# Patient Record
Sex: Male | Born: 1990 | Race: Black or African American | Hispanic: No | Marital: Single | Smoking: Current every day smoker
Health system: Southern US, Community
[De-identification: ages and names within clinical notes are randomized; demographics above are authoritative.]

## PROBLEM LIST (undated history)

## (undated) DIAGNOSIS — Z8619 Personal history of other infectious and parasitic diseases: Secondary | ICD-10-CM

---

## 2010-11-19 ENCOUNTER — Emergency Department (HOSPITAL_COMMUNITY): Payer: Self-pay

## 2010-11-19 ENCOUNTER — Emergency Department (HOSPITAL_COMMUNITY)
Admission: EM | Admit: 2010-11-19 | Discharge: 2010-11-19 | Disposition: A | Discharge status: Home / Self Care | Payer: Self-pay | Attending: Emergency Medicine | Admitting: Emergency Medicine

## 2010-11-19 DIAGNOSIS — S20219A Contusion of unspecified front wall of thorax, initial encounter: Secondary | ICD-10-CM | POA: Insufficient documentation

## 2010-11-19 DIAGNOSIS — R51 Headache: Secondary | ICD-10-CM | POA: Insufficient documentation

## 2010-11-19 DIAGNOSIS — R079 Chest pain, unspecified: Secondary | ICD-10-CM | POA: Insufficient documentation

## 2010-11-19 DIAGNOSIS — R059 Cough, unspecified: Secondary | ICD-10-CM | POA: Insufficient documentation

## 2010-11-19 DIAGNOSIS — S0990XA Unspecified injury of head, initial encounter: Secondary | ICD-10-CM | POA: Insufficient documentation

## 2010-11-19 DIAGNOSIS — Y92009 Unspecified place in unspecified non-institutional (private) residence as the place of occurrence of the external cause: Secondary | ICD-10-CM | POA: Insufficient documentation

## 2010-11-19 DIAGNOSIS — R55 Syncope and collapse: Secondary | ICD-10-CM | POA: Insufficient documentation

## 2010-11-19 DIAGNOSIS — R109 Unspecified abdominal pain: Secondary | ICD-10-CM | POA: Insufficient documentation

## 2010-11-19 DIAGNOSIS — R05 Cough: Secondary | ICD-10-CM | POA: Insufficient documentation

## 2010-11-19 DIAGNOSIS — R1011 Right upper quadrant pain: Secondary | ICD-10-CM | POA: Insufficient documentation

## 2010-11-19 DIAGNOSIS — S301XXA Contusion of abdominal wall, initial encounter: Secondary | ICD-10-CM | POA: Insufficient documentation

## 2010-11-19 LAB — POCT I-STAT, CHEM 8
BUN: 17 mg/dL (ref 6–23)
Calcium, Ion: 1.15 mmol/L (ref 1.12–1.32)
Chloride: 105 mEq/L (ref 96–112)
Potassium: 3.8 mEq/L (ref 3.5–5.1)

## 2010-11-19 MED ORDER — IOHEXOL 300 MG/ML  SOLN
125.0000 mL | Freq: Once | INTRAMUSCULAR | Status: AC | PRN
  Administered 2010-11-19: 125 mL via INTRAVENOUS

## 2011-01-15 ENCOUNTER — Emergency Department (HOSPITAL_COMMUNITY)
Admission: EM | Admit: 2011-01-15 | Discharge: 2011-01-15 | Disposition: A | Discharge status: Home / Self Care | Payer: Self-pay | Attending: Emergency Medicine | Admitting: Emergency Medicine

## 2011-01-15 DIAGNOSIS — T6391XA Toxic effect of contact with unspecified venomous animal, accidental (unintentional), initial encounter: Secondary | ICD-10-CM | POA: Insufficient documentation

## 2011-01-15 DIAGNOSIS — R220 Localized swelling, mass and lump, head: Secondary | ICD-10-CM | POA: Insufficient documentation

## 2011-01-15 DIAGNOSIS — R221 Localized swelling, mass and lump, neck: Secondary | ICD-10-CM | POA: Insufficient documentation

## 2011-01-15 DIAGNOSIS — T63391A Toxic effect of venom of other spider, accidental (unintentional), initial encounter: Secondary | ICD-10-CM | POA: Insufficient documentation

## 2011-01-16 ENCOUNTER — Emergency Department (HOSPITAL_COMMUNITY)
Admission: EM | Admit: 2011-01-16 | Discharge: 2011-01-16 | Disposition: A | Discharge status: Home / Self Care | Payer: Self-pay | Attending: Emergency Medicine | Admitting: Emergency Medicine

## 2011-01-16 DIAGNOSIS — R229 Localized swelling, mass and lump, unspecified: Secondary | ICD-10-CM | POA: Insufficient documentation

## 2011-01-16 DIAGNOSIS — T6391XA Toxic effect of contact with unspecified venomous animal, accidental (unintentional), initial encounter: Secondary | ICD-10-CM | POA: Insufficient documentation

## 2011-01-16 DIAGNOSIS — T63391A Toxic effect of venom of other spider, accidental (unintentional), initial encounter: Secondary | ICD-10-CM | POA: Insufficient documentation

## 2011-05-01 ENCOUNTER — Emergency Department (HOSPITAL_COMMUNITY)
Admission: EM | Admit: 2011-05-01 | Discharge: 2011-05-01 | Disposition: A | Discharge status: Home / Self Care | Payer: Self-pay | Attending: Emergency Medicine | Admitting: Emergency Medicine

## 2011-05-01 ENCOUNTER — Other Ambulatory Visit: Payer: Self-pay

## 2011-05-01 ENCOUNTER — Encounter: Payer: Self-pay | Admitting: Emergency Medicine

## 2011-05-01 DIAGNOSIS — R42 Dizziness and giddiness: Secondary | ICD-10-CM | POA: Insufficient documentation

## 2011-05-01 DIAGNOSIS — K625 Hemorrhage of anus and rectum: Secondary | ICD-10-CM | POA: Insufficient documentation

## 2011-05-01 DIAGNOSIS — R55 Syncope and collapse: Secondary | ICD-10-CM | POA: Insufficient documentation

## 2011-05-01 LAB — CBC
HCT: 42.9 % (ref 39.0–52.0)
Hemoglobin: 15.1 g/dL (ref 13.0–17.0)
MCV: 86 fL (ref 78.0–100.0)
RDW: 12.2 % (ref 11.5–15.5)
WBC: 6.7 10*3/uL (ref 4.0–10.5)

## 2011-05-01 LAB — COMPREHENSIVE METABOLIC PANEL
BUN: 10 mg/dL (ref 6–23)
CO2: 30 mEq/L (ref 19–32)
Calcium: 9 mg/dL (ref 8.4–10.5)
Chloride: 101 mEq/L (ref 96–112)
Creatinine, Ser: 1.01 mg/dL (ref 0.50–1.35)
GFR calc Af Amer: >90 mL/min (ref >90)
GFR calc non Af Amer: >90 mL/min (ref >90)
Glucose, Bld: 84 mg/dL (ref 70–99)
Total Bilirubin: 0.3 mg/dL (ref 0.3–1.2)

## 2011-05-01 LAB — URINALYSIS, ROUTINE W REFLEX MICROSCOPIC
Hgb urine dipstick: NEGATIVE
Nitrite: NEGATIVE
Protein, ur: 30 mg/dL — AB
Specific Gravity, Urine: 1.028 (ref 1.005–1.030)
Urobilinogen, UA: 1 mg/dL (ref 0.0–1.0)

## 2011-05-01 LAB — DIFFERENTIAL
Basophils Absolute: 0 10*3/uL (ref 0.0–0.1)
Eosinophils Relative: 8 % — ABNORMAL HIGH (ref 0–5)
Lymphocytes Relative: 14 % (ref 12–46)
Lymphs Abs: 1 10*3/uL (ref 0.7–4.0)
Monocytes Absolute: 0.8 10*3/uL (ref 0.1–1.0)
Monocytes Relative: 12 % (ref 3–12)
Neutro Abs: 4.4 10*3/uL (ref 1.7–7.7)

## 2011-05-01 LAB — URINE MICROSCOPIC-ADD ON

## 2011-05-01 LAB — OCCULT BLOOD, POC DEVICE: Fecal Occult Bld: POSITIVE

## 2011-05-01 LAB — RAPID URINE DRUG SCREEN, HOSP PERFORMED
Amphetamines: NOT DETECTED
Opiates: NOT DETECTED

## 2011-05-01 LAB — ETHANOL: Alcohol, Ethyl (B): <11 mg/dL (ref 0–11)

## 2011-05-01 NOTE — ED Notes (Signed)
Pt. Discharged to home ambulatory gait steady, NAD noted

## 2011-05-01 NOTE — ED Provider Notes (Signed)
History     CSN: [id number] Arrival date & time: 05/01/2011  6:07 PM   First MD Initiated Contact with Patient 05/01/11 1824      Chief Complaint  Patient presents with  . Loss of Consciousness    (Consider location/radiation/quality/duration/timing/severity/associated sxs/prior treatment) Patient is a 20 y.o. male presenting with syncope. The history is provided by the patient and the EMS personnel.  Loss of Consciousness   he states that he has been having rectal bleeding for the last 6 months. Today he had a large, which was all bright red blood. He denies any abdominal pain, nausea, vomiting. He got dizzy and passed out. There is some questionable as to whether there was a seizure. He did not lose control of bowel or bladder did not bite his lip or tongue. He has not seen a physician related to his rectal bleeding. Symptoms are severe. Nothing makes it better, nothing makes worse. He does occasionally drink alcohol, but has not consumed any today. He he does smoke marijuana and smokes cigarettes but cannot be specific about quantity. He denies using other drugs.  No past medical history on file.  No past surgical history on file.  No family history on file.  History  Substance Use Topics  . Smoking status: Not on file  . Smokeless tobacco: Not on file  . Alcohol Use: Not on file      Review of Systems  Cardiovascular: Positive for syncope.  All other systems reviewed and are negative.    Allergies  Review of patient's allergies indicates no known allergies.  Home Medications  No current outpatient prescriptions on file.  BP 142/74  Pulse 79  Temp(Src) 98.3 F (36.8 C) (Oral)  Resp 14  SpO2 100%  Physical Exam  Nursing note and vitals reviewed.  20 year old male who is awake, alert, oriented and in no acute distress. Vital signs are normal. Head is normocephalic and atraumatic. PERRLA, EOMI. Conjunctiva are pink. Oropharynx is clear. Neck is supple without  adenopathy, or JVD, or bruit. Back is nontender. Lungs are clear without rales, wheezes, rhonchi. Heart has regular rate and rhythm without murmur. Abdomen is soft, flat, nontender without masses or hepatosplenomegaly. Rectal has normal tone, no masses, stool is normal color but is guaiac positive. Extremities have no cyanosis or edema, no evidence of trauma. Neurologic: Mental status is normal, cranial nerves are intact, there are no motor or sensory deficits. Psychiatric: No abnormalities of mood or affect.  ED Course  Procedures (including critical care time)   Labs Reviewed  OCCULT BLOOD, POC DEVICE  CBC  DIFFERENTIAL  COMPREHENSIVE METABOLIC PANEL  URINALYSIS, ROUTINE W REFLEX MICROSCOPIC  ETHANOL  URINE RAPID DRUG SCREEN (HOSP PERFORMED)  POCT OCCULT BLOOD STOOL, DEVICE   No results found. Results for orders placed during the hospital encounter of 05/01/11  CBC      Component Value Range   WBC 6.7  4.0 - 10.5 (K/uL)   RBC 4.99  4.22 - 5.81 (MIL/uL)   Hemoglobin 15.1  13.0 - 17.0 (g/dL)   HCT 42.5  95.6 - 38.7 (%)   MCV 86.0  78.0 - 100.0 (fL)   MCH 30.3  26.0 - 34.0 (pg)   MCHC 35.2  30.0 - 36.0 (g/dL)   RDW 56.4  33.2 - 95.1 (%)   Platelets 205  150 - 400 (K/uL)  DIFFERENTIAL      Component Value Range   Neutrophils Relative 65  43 - 77 (%)   Neutro Abs  4.4  1.7 - 7.7 (K/uL)   Lymphocytes Relative 14  12 - 46 (%)   Lymphs Abs 1.0  0.7 - 4.0 (K/uL)   Monocytes Relative 12  3 - 12 (%)   Monocytes Absolute 0.8  0.1 - 1.0 (K/uL)   Eosinophils Relative 8 (*) 0 - 5 (%)   Eosinophils Absolute 0.5  0.0 - 0.7 (K/uL)   Basophils Relative 1  0 - 1 (%)   Basophils Absolute 0.0  0.0 - 0.1 (K/uL)  COMPREHENSIVE METABOLIC PANEL      Component Value Range   Sodium 139  135 - 145 (mEq/L)   Potassium 3.5  3.5 - 5.1 (mEq/L)   Chloride 101  96 - 112 (mEq/L)   CO2 30  19 - 32 (mEq/L)   Glucose, Bld 84  70 - 99 (mg/dL)   BUN 10  6 - 23 (mg/dL)   Creatinine, Ser 1.61  0.50 - 1.35  (mg/dL)   Calcium 9.0  8.4 - 09.6 (mg/dL)   Total Protein 7.4  6.0 - 8.3 (g/dL)   Albumin 4.1  3.5 - 5.2 (g/dL)   AST 16  0 - 37 (U/L)   ALT 13  0 - 53 (U/L)   Alkaline Phosphatase 62  39 - 117 (U/L)   Total Bilirubin 0.3  0.3 - 1.2 (mg/dL)   GFR calc non Af Amer >90  >90 (mL/min)   GFR calc Af Amer >90  >90 (mL/min)  URINALYSIS, ROUTINE W REFLEX MICROSCOPIC      Component Value Range   Color, Urine YELLOW  YELLOW    APPearance CLEAR  CLEAR    Specific Gravity, Urine 1.028  1.005 - 1.030    pH 8.5 (*) 5.0 - 8.0    Glucose, UA NEGATIVE  NEGATIVE (mg/dL)   Hgb urine dipstick NEGATIVE  NEGATIVE    Bilirubin Urine SMALL (*) NEGATIVE    Ketones, ur NEGATIVE  NEGATIVE (mg/dL)   Protein, ur 30 (*) NEGATIVE (mg/dL)   Urobilinogen, UA 1.0  0.0 - 1.0 (mg/dL)   Nitrite NEGATIVE  NEGATIVE    Leukocytes, UA NEGATIVE  NEGATIVE   ETHANOL      Component Value Range   Alcohol, Ethyl (B) <11  0 - 11 (mg/dL)  URINE RAPID DRUG SCREEN (HOSP PERFORMED)      Component Value Range   Opiates NONE DETECTED  NONE DETECTED    Cocaine NONE DETECTED  NONE DETECTED    Benzodiazepines NONE DETECTED  NONE DETECTED    Amphetamines NONE DETECTED  NONE DETECTED    Tetrahydrocannabinol POSITIVE (*) NONE DETECTED    Barbiturates NONE DETECTED  NONE DETECTED   OCCULT BLOOD, POC DEVICE      Component Value Range   Fecal Occult Bld POSITIVE    URINE MICROSCOPIC-ADD ON      Component Value Range   Squamous Epithelial / LPF RARE  RARE    WBC, UA 0-2  <3 (WBC/hpf)   RBC / HPF 0-2  <3 (RBC/hpf)   Urine-Other MUCOUS PRESENT     No results found.    No diagnosis found.   Date: 05/01/2011  Rate: 62  Rhythm: normal sinus rhythm  QRS Axis: normal  Intervals: normal  ST/T Wave abnormalities: normal  Conduction Disutrbances:none  Narrative Interpretation: Left ventricular hypertrophy. No old ECG available for comparison.  Old EKG Reviewed: none available  His workup is essentially unremarkable except for a  positive stool guaiac. His orthostatic vital signs are also unremarkable. Although he  is bleeding, there is no evidence of significant blood loss. He will be referred to GI for further evaluation.  MDM  Syncope etiology unclear. GI bleed which appears to be chronic.        Dione Booze, MD 05/01/11 2200

## 2011-05-01 NOTE — ED Notes (Signed)
Pt reports large amount of blood in his stool today. States it filled the bowl.  Pt states he has been having blood in stool since June.
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
## 2011-05-02 ENCOUNTER — Emergency Department (HOSPITAL_COMMUNITY)
Admission: EM | Admit: 2011-05-02 | Discharge: 2011-05-03 | Disposition: A | Discharge status: Home / Self Care | Payer: Self-pay | Attending: Emergency Medicine | Admitting: Emergency Medicine

## 2011-05-02 ENCOUNTER — Encounter (HOSPITAL_COMMUNITY): Payer: Self-pay | Admitting: Emergency Medicine

## 2011-05-02 DIAGNOSIS — B349 Viral infection, unspecified: Secondary | ICD-10-CM

## 2011-05-02 DIAGNOSIS — B9789 Other viral agents as the cause of diseases classified elsewhere: Secondary | ICD-10-CM | POA: Insufficient documentation

## 2011-05-02 DIAGNOSIS — R05 Cough: Secondary | ICD-10-CM | POA: Insufficient documentation

## 2011-05-02 DIAGNOSIS — R059 Cough, unspecified: Secondary | ICD-10-CM | POA: Insufficient documentation

## 2011-05-02 DIAGNOSIS — R071 Chest pain on breathing: Secondary | ICD-10-CM | POA: Insufficient documentation

## 2011-05-02 DIAGNOSIS — R0781 Pleurodynia: Secondary | ICD-10-CM

## 2011-05-02 NOTE — ED Notes (Signed)
Pt was seen at [facility] last night for same and was told he had blood in his GI tract  Pt states today he passed a bloody stool dark red in color  Pt states he had a syncopal episode yesterday but states he feels faint today

## 2011-05-03 ENCOUNTER — Emergency Department (HOSPITAL_COMMUNITY): Payer: Self-pay

## 2011-05-03 LAB — POCT I-STAT, CHEM 8
Calcium, Ion: 1.12 mmol/L (ref 1.12–1.32)
Glucose, Bld: 110 mg/dL — ABNORMAL HIGH (ref 70–99)
HCT: 45 % (ref 39.0–52.0)
Hemoglobin: 15.3 g/dL (ref 13.0–17.0)
TCO2: 31 mmol/L (ref 0–100)

## 2011-05-03 MED ORDER — HYDROCOD POLST-CHLORPHEN POLST 10-8 MG/5ML PO LQCR: 5.0000 mL | Freq: Two times a day (BID) | ORAL | Status: DC | PRN

## 2011-05-03 MED ORDER — HYDROCODONE-ACETAMINOPHEN 5-325 MG PO TABS
1.0000 | ORAL_TABLET | Freq: Once | ORAL | Status: AC
  Administered 2011-05-03: 1 via ORAL
  Filled 2011-05-03: qty 1
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
## 2011-05-03 NOTE — ED Provider Notes (Signed)
History     CSN: [id number] Arrival date & time: 05/02/2011 11:08 PM   First MD Initiated Contact with Patient 05/03/11 0204      Chief Complaint  Patient presents with  . Flank Pain     Patient is a 20 y.o. male presenting with flu symptoms.  Influenza This is a new problem. The current episode started in the past 7 days. The problem occurs constantly. The problem has been gradually worsening. Associated symptoms include chills, congestion, coughing, myalgias and a sore throat. He has tried acetaminophen and NSAIDs for the symptoms. The treatment provided no relief.  Reports viral type symptoms for 2 to 3 days. Fainted last night and was seen at [facility] and told he was "bleeding". States he is coughing a lot and has chills. Also states he continues to have rectal bleeding.  History reviewed. No pertinent past medical history.  History reviewed. No pertinent past surgical history.  History reviewed. No pertinent family history.  History  Substance Use Topics  . Smoking status: Current Everyday Smoker  . Smokeless tobacco: Not on file  . Alcohol Use: No      Review of Systems  Constitutional: Positive for chills.  HENT: Positive for congestion and sore throat.   Respiratory: Positive for cough.   Musculoskeletal: Positive for myalgias.    Allergies  Review of patient's allergies indicates no known allergies.  Home Medications  No current outpatient prescriptions on file.  BP 130/70  Pulse 66  Temp(Src) 99.1 F (37.3 C) (Oral)  Resp 16  SpO2 100%  Physical Exam  Constitutional: He appears well-developed and well-nourished.  HENT:  Head: Normocephalic and atraumatic.  Eyes: Conjunctivae are normal.  Neck: Neck supple.  Cardiovascular: Normal rate and regular rhythm.   Pulmonary/Chest: Effort normal and breath sounds normal.  Abdominal: Soft. Bowel sounds are normal.  Genitourinary: Rectum normal. Guaiac negative stool.  Musculoskeletal: Normal range of  motion.  Neurological: He is alert.  Skin: Skin is warm and dry.  Psychiatric: He has a normal mood and affect.    ED Course  Procedures Findings and impression discussed w/ pt. Will plan for d/c home and encourage pt to get established w/ a PCP. Will provide a short course of medication for cough, pt is agreeable w/ plan.  Labs Reviewed  POCT I-STAT, CHEM 8 - Abnormal; Notable for the following:    Creatinine, Ser 1.40 (*)    Glucose, Bld 110 (*)    All other components within normal limits  OCCULT BLOOD, POC DEVICE  I-STAT, CHEM 8  POCT OCCULT BLOOD STOOL, DEVICE   Dg Chest 2 View  05/03/2011  *RADIOLOGY REPORT*  Clinical Data: Cough fever and flu-like symptoms.  CHEST - 2 VIEW  Comparison: Chest CT 11/19/2010  Findings: The heart size and pulmonary vascularity are normal. The lungs appear clear and expanded without focal air space disease or consolidation. No blunting of the costophrenic angles.  No pneumothorax.  IMPRESSION: No evidence of active pulmonary disease.  Original Report Authenticated By: Marlon Pel, M.D.     No diagnosis found.    MDM  Viral syndrome. Hemocult negative.        Leanne Chang, NP 05/08/11 [phone]

## 2011-05-09 NOTE — ED Provider Notes (Signed)
Medical screening examination/treatment/procedure(s) were performed by non-physician practitioner and as supervising physician I was immediately available for consultation/collaboration.   Jaysen Wey, MD 05/09/11 0232 

## 2012-04-15 ENCOUNTER — Emergency Department (HOSPITAL_COMMUNITY)
Admission: EM | Admit: 2012-04-15 | Discharge: 2012-04-15 | Disposition: A | Discharge status: Home / Self Care | Payer: Self-pay | Attending: Emergency Medicine | Admitting: Emergency Medicine

## 2012-04-15 ENCOUNTER — Encounter (HOSPITAL_COMMUNITY): Payer: Self-pay | Admitting: *Deleted

## 2012-04-15 DIAGNOSIS — H9210 Otorrhea, unspecified ear: Secondary | ICD-10-CM | POA: Insufficient documentation

## 2012-04-15 DIAGNOSIS — Z8619 Personal history of other infectious and parasitic diseases: Secondary | ICD-10-CM | POA: Insufficient documentation

## 2012-04-15 DIAGNOSIS — H60399 Other infective otitis externa, unspecified ear: Secondary | ICD-10-CM | POA: Insufficient documentation

## 2012-04-15 DIAGNOSIS — L0390 Cellulitis, unspecified: Secondary | ICD-10-CM

## 2012-04-15 DIAGNOSIS — F17200 Nicotine dependence, unspecified, uncomplicated: Secondary | ICD-10-CM | POA: Insufficient documentation

## 2012-04-15 HISTORY — DX: Personal history of other infectious and parasitic diseases: Z86.19

## 2012-04-15 MED ORDER — ANTIPYRINE-BENZOCAINE 5.4-1.4 % OT SOLN
3.0000 [drp] | Freq: Once | OTIC | Status: AC
  Administered 2012-04-15: 4 [drp] via OTIC
  Filled 2012-04-15: qty 10

## 2012-04-15 MED ORDER — ACYCLOVIR 800 MG PO TABS: 800.0000 mg | ORAL_TABLET | Freq: Every day | ORAL | Status: DC

## 2012-04-15 MED ORDER — CEPHALEXIN 500 MG PO CAPS: 500.0000 mg | ORAL_CAPSULE | Freq: Three times a day (TID) | ORAL | Status: DC

## 2012-04-15 MED ORDER — CEPHALEXIN 250 MG PO CAPS
500.0000 mg | ORAL_CAPSULE | Freq: Once | ORAL | Status: AC
  Administered 2012-04-15: 500 mg via ORAL
  Filled 2012-04-15: qty 2

## 2012-04-15 NOTE — Discharge Instructions (Signed)
Cellulitis Cellulitis is an infection of the skin and the tissue beneath it. The infected area is usually red and tender. Cellulitis occurs most often in the arms and lower legs.   CAUSES   Cellulitis is caused by bacteria that enter the skin through cracks or cuts in the skin. The most common types of bacteria that cause cellulitis are Staphylococcus and Streptococcus. SYMPTOMS    Redness and warmth.   Swelling.   Tenderness or pain.   Fever.  DIAGNOSIS  Your caregiver can usually determine what is wrong based on a physical exam. Blood tests may also be done. TREATMENT   Treatment usually involves taking an antibiotic medicine. HOME CARE INSTRUCTIONS    Take your antibiotics as directed. Finish them even if you start to feel better.   Keep the infected arm or leg elevated to reduce swelling.   Apply a warm cloth to the affected area up to 4 times per day to relieve pain.   Only take over-the-counter or prescription medicines for pain, discomfort, or fever as directed by your caregiver.   Keep all follow-up appointments as directed by your caregiver.  SEEK MEDICAL CARE IF:    You notice red streaks coming from the infected area.   Your red area gets larger or turns dark in color.   Your bone or joint underneath the infected area becomes painful after the skin has healed.   Your infection returns in the same area or another area.   You notice a swollen bump in the infected area.   You develop new symptoms.  SEEK IMMEDIATE MEDICAL CARE IF:    You have a fever.   You feel very sleepy.   You develop vomiting or diarrhea.   You have a general ill feeling (malaise) with muscle aches and pains.  MAKE SURE YOU:    Understand these instructions.   Will watch your condition.   Will get help right away if you are not doing well or get worse.  Document Released: 02/15/2005 Document Revised: 11/07/2011 Document Reviewed: 07/24/2011 [employer] Patient Information 2013  [employer]    

## 2012-04-15 NOTE — ED Notes (Signed)
Pt reports ear ache that started on Saturday.  Pt now reports that he is having jaw pain and cannot hear as well out of the (L) ear.  Pt is having drainage from ear.

## 2012-04-15 NOTE — ED Provider Notes (Signed)
History    This chart was scribed for Gavin Pound. Oletta Lamas, MD, MD by Smitty Pluck, ED Scribe. The patient was seen in room TR10C and the patient's care was started at 2:17PM.   CSN: [id number]  Arrival date & time 04/15/12  1347   None     Chief Complaint  Patient presents with  . Otalgia    (Consider location/radiation/quality/duration/timing/severity/associated sxs/prior treatment) The history is provided by the patient. No language interpreter was used.   Edwin Farmer is a 21 y.o. male who presents to the Emergency Department complaining of constant, moderate left otalgia radiating to left jaw onset 2 days ago. Pt reports that his hearing has been affected due to swelling of left ear. He mentions there is drainage from left ear. Pt has used hydrogen peroxide without relief. He reports that he awoke with ear pain. He denies swimming recently and any other pain.   Past Medical History  Diagnosis Date  . History of chicken pox     History reviewed. No pertinent past surgical history.  History reviewed. No pertinent family history.  History  Substance Use Topics  . Smoking status: Current Every Day Smoker  . Smokeless tobacco: Not on file  . Alcohol Use: No      Review of Systems  Constitutional: Negative for fever and chills.  HENT: Positive for ear pain.   Respiratory: Negative for shortness of breath.   Gastrointestinal: Negative for nausea and vomiting.  Neurological: Negative for weakness.    Allergies  Shellfish allergy  Home Medications   Current Outpatient Rx  Name  Route  Sig  Dispense  Refill  . ACYCLOVIR 800 MG PO TABS   Oral   Take 1 tablet (800 mg total) by mouth 5 (five) times daily.   35 tablet   0   . CEPHALEXIN 500 MG PO CAPS   Oral   Take 1 capsule (500 mg total) by mouth 3 (three) times daily.   30 capsule   0     BP 149/68  Pulse 91  Temp 98.8 F (37.1 C)  Resp 18  SpO2 100%  Physical Exam  Nursing note and vitals  reviewed. Constitutional: He is oriented to person, place, and time. He appears well-developed and well-nourished. No distress.  HENT:  Head: Normocephalic and atraumatic.  Right Ear: External ear and ear canal normal. No tenderness.  Left Ear: There is drainage, swelling and tenderness. No mastoid tenderness. No decreased hearing is noted.  Ears:  Mouth/Throat: Uvula is midline, oropharynx is clear and moist and mucous membranes are normal.       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  Neck: Neck supple. No tracheal deviation present.  Cardiovascular: Normal rate.   Pulmonary/Chest: Effort normal. No respiratory distress.  Musculoskeletal: Normal range of motion.  Lymphadenopathy:    He has no cervical adenopathy.  Neurological: He is alert and oriented to person, place, and time.  Skin: Skin is warm and dry.  Psychiatric: He has a normal mood and affect. His behavior is normal.    ED Course  Procedures (including critical care time) DIAGNOSTIC STUDIES: Oxygen Saturation is 100% on room air, normal by my interpretation.    COORDINATION OF CARE: 2:20 PM Discussed ED treatment with pt       Labs Reviewed - No data to display No results found.   1. Cellulitis       MDM  I personally performed the services described in this documentation, which was scribed in my presence. The recorded information has been reviewed and is accurate.  Pt with swelling, irritation of superficial dermis of ear, no frank ulcerations but cannot rule out shingles of ear.  No lesions to nose or eye.  Canal is swollen, so unable to visualize inside ear canal.  Will treat with both oral keflex and acyclovir.  Pt did have chicken pox as a child.  No fevers, normal mentation, not septic in appearance.       Gavin Pound. Oletta Lamas, MD 04/15/12 1448

## 2013-09-16 ENCOUNTER — Emergency Department (HOSPITAL_COMMUNITY): Payer: Self-pay

## 2013-09-16 ENCOUNTER — Emergency Department (HOSPITAL_COMMUNITY)
Admission: EM | Admit: 2013-09-16 | Discharge: 2013-09-16 | Disposition: A | Discharge status: Home / Self Care | Payer: Self-pay | Attending: Emergency Medicine | Admitting: Emergency Medicine

## 2013-09-16 ENCOUNTER — Encounter (HOSPITAL_COMMUNITY): Payer: Self-pay | Admitting: Emergency Medicine

## 2013-09-16 DIAGNOSIS — M65839 Other synovitis and tenosynovitis, unspecified forearm: Secondary | ICD-10-CM | POA: Insufficient documentation

## 2013-09-16 DIAGNOSIS — M778 Other enthesopathies, not elsewhere classified: Secondary | ICD-10-CM

## 2013-09-16 DIAGNOSIS — F17200 Nicotine dependence, unspecified, uncomplicated: Secondary | ICD-10-CM | POA: Insufficient documentation

## 2013-09-16 DIAGNOSIS — M65849 Other synovitis and tenosynovitis, unspecified hand: Principal | ICD-10-CM

## 2013-09-16 DIAGNOSIS — Z8619 Personal history of other infectious and parasitic diseases: Secondary | ICD-10-CM | POA: Insufficient documentation

## 2013-09-16 MED ORDER — IBUPROFEN 800 MG PO TABS: 800.0000 mg | ORAL_TABLET | Freq: Three times a day (TID) | ORAL | Status: AC | PRN

## 2013-09-16 MED ORDER — HYDROCODONE-ACETAMINOPHEN 5-325 MG PO TABS: 1.0000 | ORAL_TABLET | Freq: Four times a day (QID) | ORAL | Status: AC | PRN

## 2013-09-16 NOTE — ED Notes (Signed)
Pt reports L wrist pain, no known injuries but states he plays a lot of sports. No deformity noted, states that he takes 400 mg Ibuprofen at home BID without relief. Pt a&o x4, ambulatory to triage, NAD noted at this time

## 2013-09-16 NOTE — ED Provider Notes (Signed)
CSN: [id number]     Arrival date & time 09/16/13  2001 History   First MD Initiated Contact with Patient 09/16/13 2048     Chief Complaint  Patient presents with  . Wrist Injury     (Consider location/radiation/quality/duration/timing/severity/associated sxs/prior Treatment) HPI Patient presents to the emergency department with left wrist pain started several days, ago.  Patient, states he does a lot of activities and works out a lot.  Patient, states, that ibuprofen wasn't helping, but has not helped the last few days.  Patient, states, that movement of the wrist and thumb pain, worse.  Patient denies weakness, numbness, dizziness, nausea, or vomiting. Past Medical History  Diagnosis Date  . History of chicken pox    History reviewed. No pertinent past surgical history. History reviewed. No pertinent family history. History  Substance Use Topics  . Smoking status: Current Every Day Smoker  . Smokeless tobacco: Not on file  . Alcohol Use: No    Review of Systems  All other systems negative except as documented in the HPI. All pertinent positives and negatives as reviewed in the HPI. Allergies  Shellfish allergy  Home Medications   Prior to Admission medications   Medication Sig Start Date End Date Taking? Authorizing Provider  ibuprofen (ADVIL,MOTRIN) 200 MG tablet Take 400 mg by mouth every 6 (six) hours as needed for moderate pain.   Yes Historical Provider, MD   BP 140/56  Pulse 99  Temp(Src) 98.4 F (36.9 C) (Oral)  Resp 20  Ht 5\' 8"  (1.727 m)  Wt 168 lb (76.204 kg)  BMI 25.55 kg/m2  SpO2 99% Physical Exam  Nursing note and vitals reviewed. Constitutional: He is oriented to person, place, and time. He appears well-developed and well-nourished.  HENT:  Head: Normocephalic and atraumatic.  Neck: Normal range of motion.  Musculoskeletal:       Left wrist: He exhibits decreased range of motion and tenderness. He exhibits no bony tenderness, no swelling, no  crepitus, no deformity and no laceration.  Patient has pain with movement of the thumb and wrist.  The patient has positive Finkelstein's test.  Neurological: He is alert and oriented to person, place, and time.  Skin: Skin is warm and dry.    ED Course  Procedures (including critical care time)   Dg Wrist Complete Left  09/16/2013   CLINICAL DATA:  Four week history of progressive wrist pain  EXAM: LEFT WRIST - COMPLETE 3+ VIEW  COMPARISON:  None.  FINDINGS: There is no evidence of fracture or dislocation. There is no evidence of arthropathy or other focal bone abnormality. Soft tissues are unremarkable. No inflammatory erosions or destructive changes.  IMPRESSION: Negative.   Electronically Signed   By: Malachy MoanHeath  McCullough M.D.   On: 09/16/2013 21:58   Patient be treated for tendinitis of the wrist and thumb.  Patient is advised the results will follow with orthopedics.  Told to return here as needed.  Ice and heat to the wrist and hand     Carlyle DollyChristopher W Dahl Higinbotham, PA-C 09/20/13 1610

## 2013-09-16 NOTE — Discharge Instructions (Signed)
Apply Ice and heat to your wrist return here as needed.  Followup with the orthopedist provided

## 2013-09-26 NOTE — ED Provider Notes (Signed)
Medical screening examination/treatment/procedure(s) were performed by non-physician practitioner and as supervising physician I was immediately available for consultation/collaboration.   EKG Interpretation None       Jamacia Jester, MD 09/26/13 2124 

## 2014-09-11 IMAGING — CR DG WRIST COMPLETE 3+V*L*
4 series · 4 of 4 positions shown · non-contrast
Comparison: None.

CLINICAL DATA: Four week history of progressive wrist pain

EXAM:
LEFT WRIST - COMPLETE 3+ VIEW

[x wrist pa left]
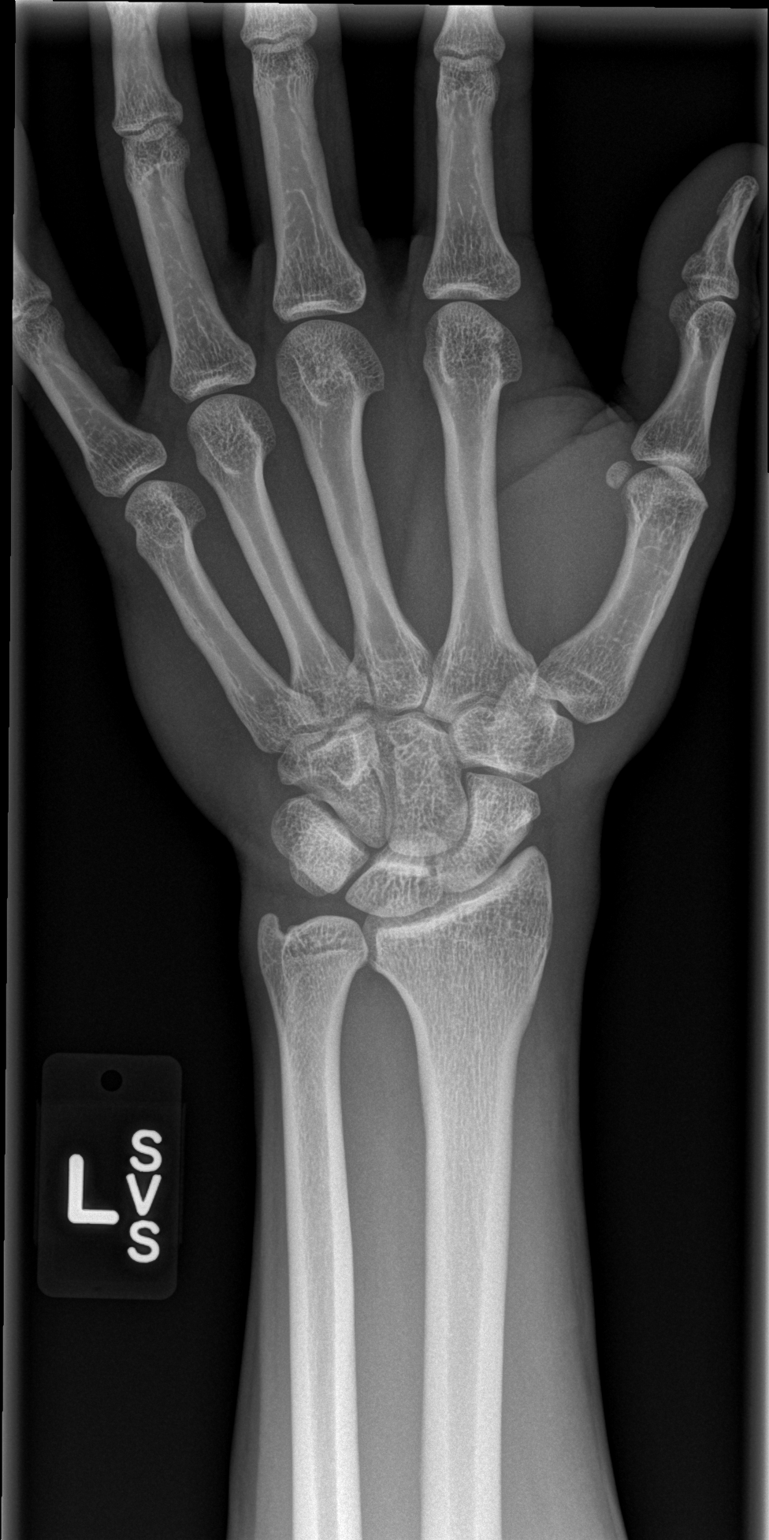

[x wrist obl left]
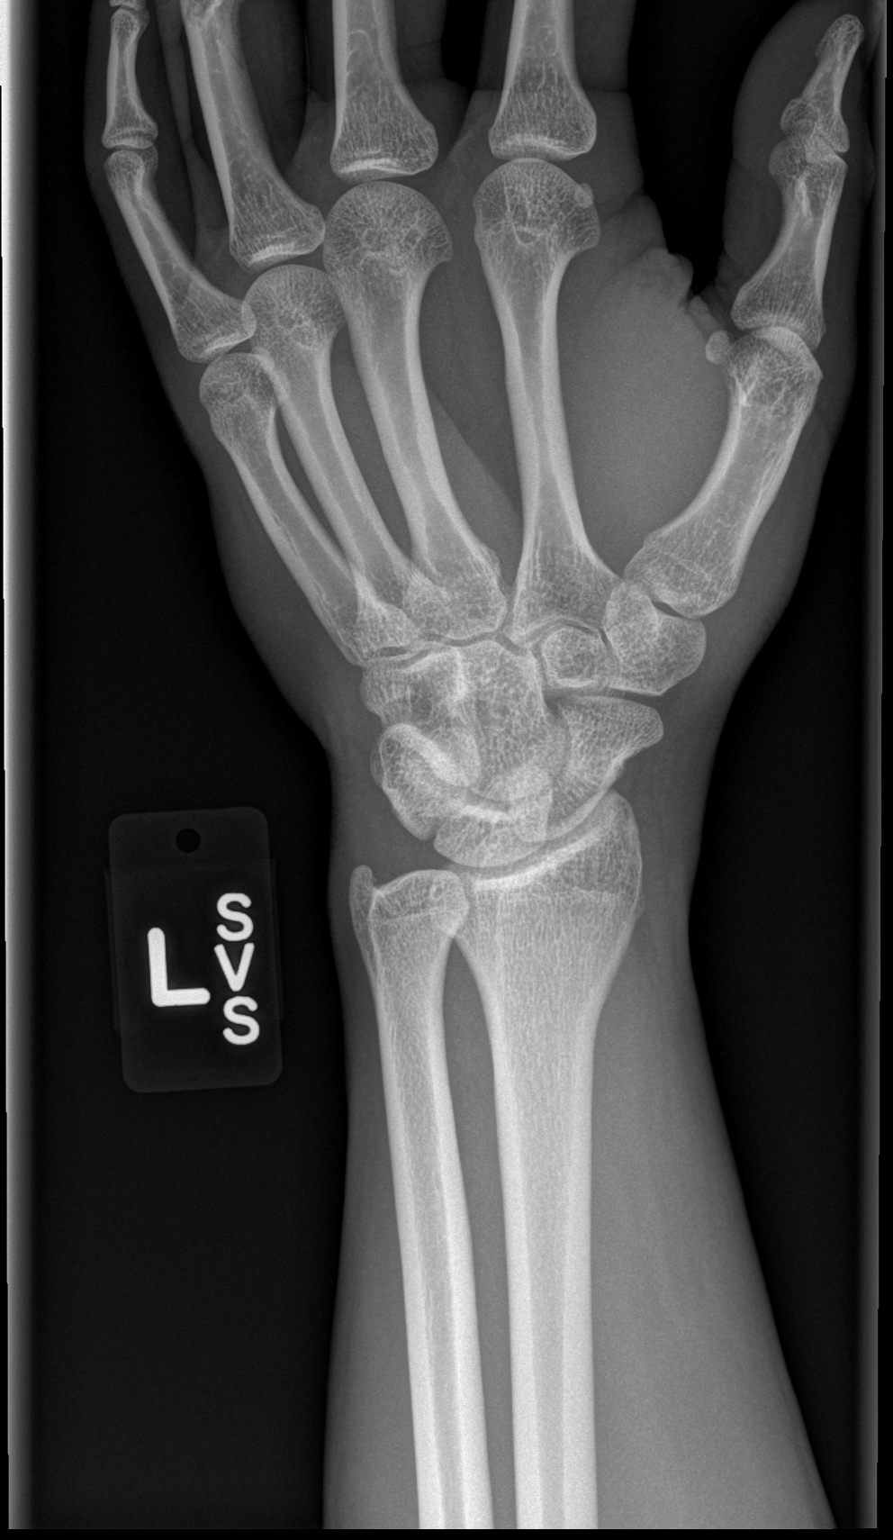

[x wrist lat left]
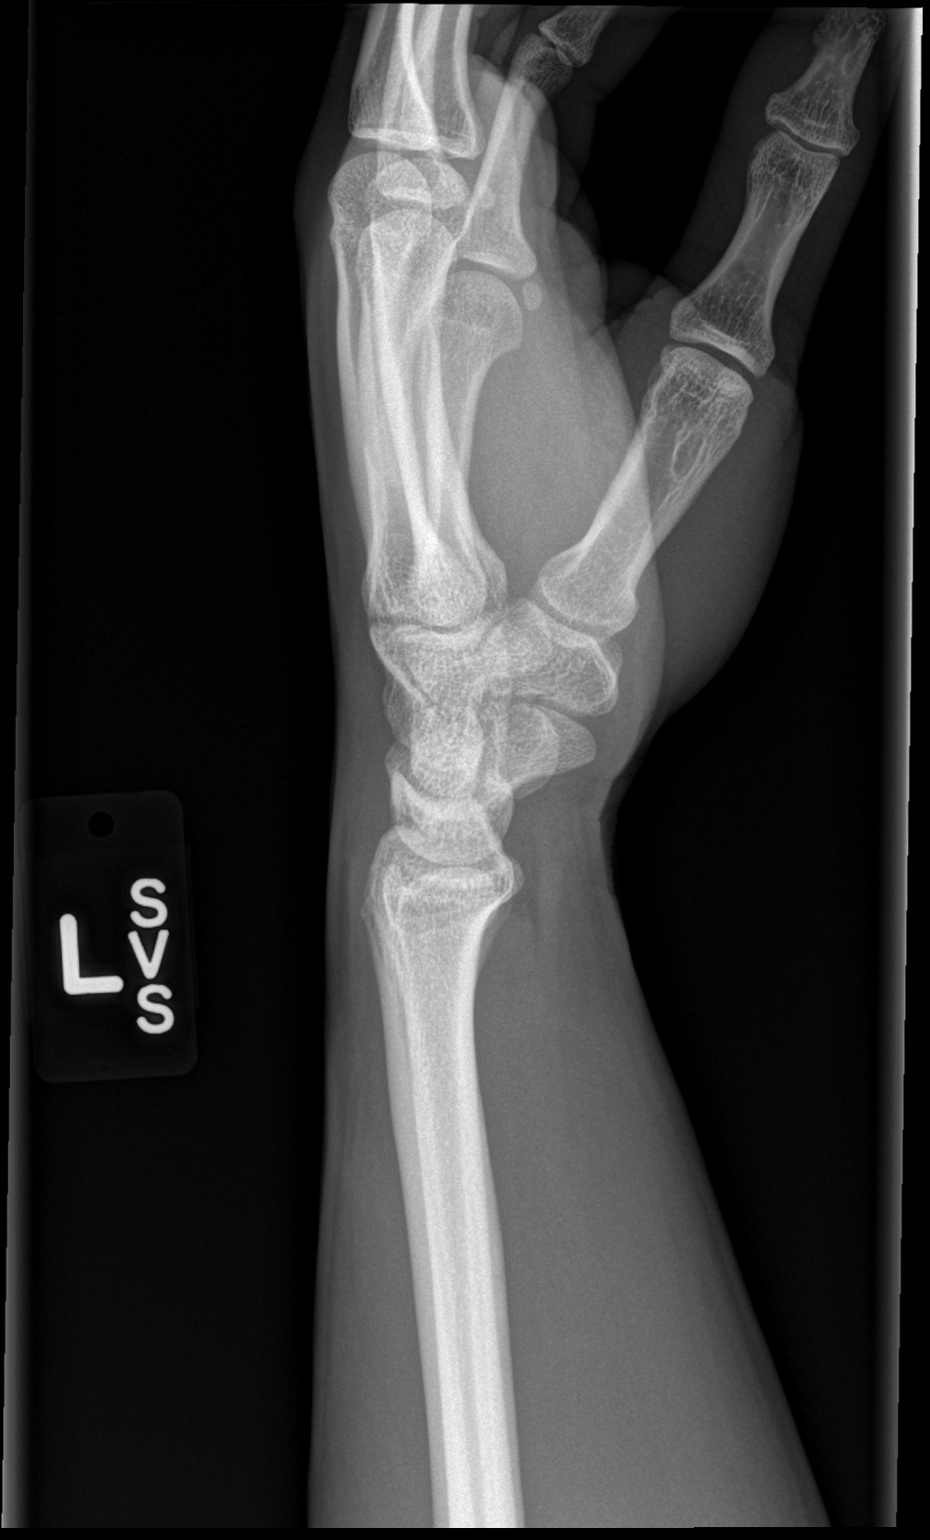

[x wrist navicular view left]
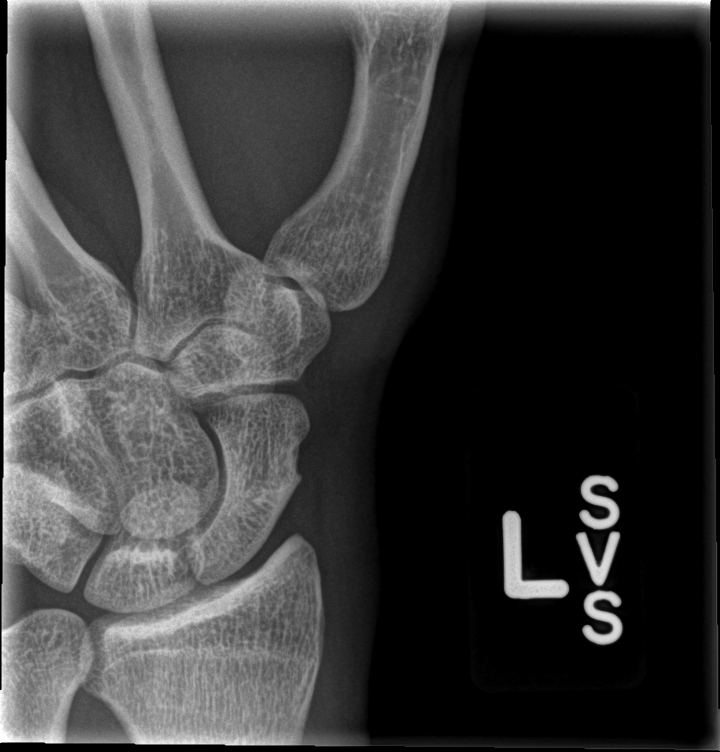

[4 of 4 positions shown; findings below may reference images not displayed]

FINDINGS: There is no evidence of fracture or dislocation. There is no
evidence of arthropathy or other focal bone abnormality. Soft
tissues are unremarkable. No inflammatory erosions or destructive
changes.
IMPRESSION: Negative.
